# Patient Record
Sex: Male | Born: 1985 | Race: White | Hispanic: No | Marital: Single | Smoking: Never smoker
Health system: Southern US, Community
[De-identification: ages and names within clinical notes are randomized; demographics above are authoritative.]

---
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
## 2016-11-01 ENCOUNTER — Emergency Department (HOSPITAL_COMMUNITY)
Admission: EM | Admit: 2016-11-01 | Discharge: 2016-11-01 | Disposition: A | Discharge status: Home / Self Care | Payer: 59 | Attending: Emergency Medicine | Admitting: Emergency Medicine

## 2016-11-01 ENCOUNTER — Encounter (HOSPITAL_COMMUNITY): Payer: Self-pay | Admitting: Emergency Medicine

## 2016-11-01 DIAGNOSIS — A09 Infectious gastroenteritis and colitis, unspecified: Secondary | ICD-10-CM | POA: Insufficient documentation

## 2016-11-01 DIAGNOSIS — R197 Diarrhea, unspecified: Secondary | ICD-10-CM

## 2016-11-01 NOTE — ED Triage Notes (Signed)
Pt reports diarrhea since Saturday, denies any associated symptoms. Pt a/ox4, resp e/u.

## 2016-11-01 NOTE — ED Provider Notes (Signed)
[facility]EM[facility]Y DEPT Provider Note   CSN: [id number] Arrival date & time: 11/01/16  [id number]     History   Chief Complaint Chief Complaint  Patient presents with  . Diarrhea    HPI Keith Blackburn is a 31 y.o. male.  The history is provided by the patient.  Diarrhea   This is a new problem. Episode onset: 4-5 days. The problem occurs 2 to 4 times per day. The problem has not changed since onset.The stool consistency is described as watery. There has been no fever. Associated symptoms include abdominal pain. Pertinent negatives include no vomiting, no arthralgias, no myalgias, no URI and no cough. Associated symptoms comments: Intermittent abdominal pain that is crampy in nature and usually in the left lower quadrant. It comes and goes. Does not seem to be worse with eating. Patient has still been able to eat and drink. No blood in the stool.. He has tried nothing for the symptoms. The treatment provided no relief. Risk factors: no ill contact, recent abx in the last 2 months or bad food exposure/travel. Past medical history comments: none.    History reviewed. No pertinent past medical history.  There are no active problems to display for this patient.   History reviewed. No pertinent surgical history.     Home Medications    Prior to Admission medications   Not on File    Family History No family history on file.  Social History Social History  Substance Use Topics  . Smoking status: Not on file  . Smokeless tobacco: Not on file  . Alcohol use Not on file     Allergies   Patient has no known allergies.   Review of Systems Review of Systems  Respiratory: Negative for cough.   Gastrointestinal: Positive for abdominal pain and diarrhea. Negative for vomiting.  Musculoskeletal: Negative for arthralgias and myalgias.  All other systems reviewed and are negative.    Physical Exam Updated Vital Signs BP 137/88 (BP Location: Left Arm)   Pulse 70   Temp 98.2 F  (36.8 C) (Oral)   Resp 18   Ht 5\' 9"  (1.753 m)   Wt 225 lb (102.1 kg)   SpO2 98%   BMI 33.23 kg/m   Physical Exam  Constitutional: He is oriented to person, place, and time. He appears well-developed and well-nourished. No distress.  HENT:  Head: Normocephalic and atraumatic.  Mouth/Throat: Oropharynx is clear and moist.  Eyes: Conjunctivae and EOM are normal. Pupils are equal, round, and reactive to light.  Neck: Normal range of motion. Neck supple.  Cardiovascular: Normal rate, regular rhythm and intact distal pulses.   No murmur heard. Pulmonary/Chest: Effort normal and breath sounds normal. No respiratory distress. He has no wheezes. He has no rales.  Abdominal: Soft. He exhibits no distension. There is no tenderness. There is no rebound and no guarding.  Musculoskeletal: Normal range of motion. He exhibits no edema or tenderness.  Neurological: He is alert and oriented to person, place, and time.  Skin: Skin is warm and dry. No rash noted. No erythema.  Psychiatric: He has a normal mood and affect. His behavior is normal.  Nursing note and vitals reviewed.    ED Treatments / Results  Labs (all labs ordered are listed, but only abnormal results are displayed) Labs Reviewed - No data to display  EKG  EKG Interpretation None       Radiology No results found.  Procedures Procedures (including critical care time)  Medications Ordered in ED  Medications - No data to display   Initial Impression / Assessment and Plan / ED Course  I have reviewed the triage vital signs and the nursing notes.  Pertinent labs & imaging results that were available during my care of the patient were reviewed by me and considered in my medical decision making (see chart for details).     Patient presenting with diarrhea for the last 3-4 days. It has been intermittent and usually no more than 3-4 times per day. Minimal nausea intermittently but no vomiting or fever. No high risk factors  concerning for bacterial source or C. difficile. Patient has no reproducible abdominal pain currently concerning for diverticulitis or appendicitis. He does not take any medications and does not use opiates or alcohol. Discussed using Imodium and patient returning if he develops a fever, vomiting or excruciating pain that localizes to one area consistently  Final Clinical Impressions(s) / ED Diagnoses   Final diagnoses:  Diarrhea of presumed infectious origin    New Prescriptions New Prescriptions   No medications on file     Gwyneth Sprout, MD 11/01/16 [phone]

## 2016-12-22 ENCOUNTER — Emergency Department (HOSPITAL_COMMUNITY): Payer: 59

## 2016-12-22 ENCOUNTER — Encounter (HOSPITAL_COMMUNITY): Payer: Self-pay | Admitting: *Deleted

## 2016-12-22 ENCOUNTER — Emergency Department (HOSPITAL_COMMUNITY)
Admission: EM | Admit: 2016-12-22 | Discharge: 2016-12-22 | Disposition: A | Discharge status: Home / Self Care | Payer: 59 | Attending: Emergency Medicine | Admitting: Emergency Medicine

## 2016-12-22 DIAGNOSIS — Y999 Unspecified external cause status: Secondary | ICD-10-CM | POA: Diagnosis not present

## 2016-12-22 DIAGNOSIS — S8991XA Unspecified injury of right lower leg, initial encounter: Secondary | ICD-10-CM | POA: Diagnosis present

## 2016-12-22 DIAGNOSIS — M25561 Pain in right knee: Secondary | ICD-10-CM | POA: Insufficient documentation

## 2016-12-22 DIAGNOSIS — Z79899 Other long term (current) drug therapy: Secondary | ICD-10-CM | POA: Diagnosis not present

## 2016-12-22 DIAGNOSIS — Y9301 Activity, walking, marching and hiking: Secondary | ICD-10-CM | POA: Insufficient documentation

## 2016-12-22 DIAGNOSIS — Y929 Unspecified place or not applicable: Secondary | ICD-10-CM | POA: Diagnosis not present

## 2016-12-22 DIAGNOSIS — X509XXA Other and unspecified overexertion or strenuous movements or postures, initial encounter: Secondary | ICD-10-CM | POA: Insufficient documentation

## 2016-12-22 DIAGNOSIS — G8929 Other chronic pain: Secondary | ICD-10-CM | POA: Diagnosis not present

## 2016-12-22 MED ORDER — DICLOFENAC SODIUM 1 % TD GEL: 4.0000 g | Freq: Four times a day (QID) | TRANSDERMAL | 0 refills | Status: AC

## 2016-12-22 NOTE — Discharge Planning (Signed)
Pt up for discharge. EDCM reviewed chart for possible CM needs.  No needs identified or communicated.  

## 2016-12-22 NOTE — Discharge Instructions (Signed)
X-rays are normal. Please use the Voltaren gel to the knee as prescribed. Do not take any extra NSAIDS with this medication. May use Tylenol. Alternate ice and heat. Use the knee sleeve for compression and comfort. Please follow-up with orthopedist. Return to ED if you develop any redness in the area, fevers, unable to bend her leg or for any reason.

## 2016-12-22 NOTE — ED Triage Notes (Signed)
Pt reports R knee pain onset yesterday, denies injury, ambulatory, reports hearing clicking sound yesterday when walking, denies hx of injury, A&Ox4

## 2016-12-22 NOTE — ED Notes (Signed)
No large knee sleeves in stock. Ortho tech called.

## 2016-12-22 NOTE — Progress Notes (Signed)
Orthopedic Tech Progress Note Patient Details:  Keith Blackburn 02-03-1986 [id number]  Ortho Devices Type of Ortho Device: Knee Sleeve Ortho Device/Splint Location: rle Ortho Device/Splint Interventions: Application   Keith Blackburn 12/22/2016, 10:17 AM

## 2016-12-22 NOTE — ED Provider Notes (Signed)
[facility] Provider Note   CSN: [id number] Arrival date & time: 12/22/16  4098  By signing my name below, I, Phillips Climes, attest that this documentation has been prepared under the direction and in the presence of Rise Mu, PA-C.  Electronically Signed: Phillips Climes, Scribe. 12/22/2016. 10:09 AM.  History   Chief Complaint Chief Complaint  Patient presents with  . Knee Pain   Keith Blackburn is a 31 y.o. male with no pertinent PMHx who presents to the Emergency Department with complaints of his chronic right knee pain, which worsened yesterday. Pt reports hearing and feeling a "popping" when weight bearing and with movement. Sx are resolved here in the ED.   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   Pt denies experiencing any other acute sx, including fevers, nausea, vomiting numbness or weakness.   He is up and ambulating in the ED normally and with no complaints.   The history is provided by the patient. No language interpreter was used.   History reviewed. No pertinent past medical history.  There are no active problems to display for this patient.  History reviewed. No pertinent surgical history.   Home Medications    Prior to Admission medications   Medication Sig Start Date End Date Taking? Authorizing Provider  diclofenac sodium (VOLTAREN) 1 % GEL Apply 4 g topically 4 (four) times daily. 12/22/16   Rise Mu, PA-C    Family History No family history on file.  Social History Social History  Substance Use Topics  . Smoking status: Never Smoker  . Smokeless tobacco: Never Used  . Alcohol use Yes     Comment: occasional     Allergies   Patient has no known allergies.   Review of Systems Review of Systems  Constitutional: Negative for unexpected weight change.  Gastrointestinal: Negative for nausea and vomiting.  Musculoskeletal: Positive for arthralgias (Resolved).  Skin: Negative  for color change.  Neurological: Negative for weakness and numbness.     Physical Exam Updated Vital Signs BP (!) 159/78 (BP Location: Right Arm)   Pulse 78   Temp 97.7 F (36.5 C) (Oral)   Resp 14   Ht  (1.753 m)   Wt 229 lb (103.9 kg)   SpO2 99%   BMI 33.82 kg/m   Physical Exam  Constitutional: He is oriented to person, place, and time. He appears well-developed and well-nourished. No distress.  HENT:  Head: Normocephalic and atraumatic.  Cardiovascular: Normal rate.   Pulmonary/Chest: Effort normal.  Musculoskeletal:  Right knee with no erythema or edema. FROM and no tenderness to palpation. Foot/leg is neurovascularly intact. No pain with passive movement of joint. No evidence of injury or trauma. Negative Murray's sign. Negative Anterior Drawer Test. DP pulses 2+ bilaterally. Sensation intact to sharp/dull. Capillary refill normal.  Neurological: He is alert and oriented to person, place, and time.  Skin: Skin is warm and dry.  Psychiatric: He has a normal mood and affect.  Nursing note and vitals reviewed.  ED Treatments / Results  DIAGNOSTIC STUDIES: Oxygen Saturation is 99% on RA, nl by my interpretation.    COORDINATION OF CARE: 9:16 AM Discussed treatment plan with pt at bedside and pt agreed to plan. Discussed plan for outpatient f/u with orthopedics. He does not feel the need for crutches.  10:09 AM Updated pt on non-significant radiology findings. Will d/c home.   Labs (all labs ordered are listed, but only  abnormal results are displayed) Labs Reviewed - No data to display  EKG  EKG Interpretation None       Radiology Dg Knee Complete 4 Views Right  Result Date: 12/22/2016 CLINICAL DATA:  Pain and swelling EXAM: RIGHT KNEE - COMPLETE 4+ VIEW COMPARISON:  None. FINDINGS: Frontal, lateral, and bilateral oblique views were obtained. There is no fracture or dislocation. No joint effusion. Joint spaces appear normal. No erosive change. IMPRESSION: No  fracture or joint effusion.  No evident arthropathy. Electronically Signed   By: Bretta Bang III M.D.   On: 12/22/2016 09:53    Procedures Procedures (including critical care time)  Medications Ordered in ED Medications - No data to display   Initial Impression / Assessment and Plan / ED Course  I have reviewed the triage vital signs and the nursing notes.  Pertinent labs & imaging results that were available during my care of the patient were reviewed by me and considered in my medical decision making (see chart for details).     Pt presents with acute on chronic right knee pain. Neurovascularly intact. Normal strength and ROM. No sign of septic joint. Patient X-Ray negative for obvious fracture or dislocation. Pain managed in ED. Pt advised to follow up with orthopedics if symptoms persist for possibility of missed fracture diagnosis. Patient given brace while in ED, conservative therapy recommended and discussed. Patient will be dc home & is agreeable with above plan.   Final Clinical Impressions(s) / ED Diagnoses   Final diagnoses:  Acute pain of right knee    New Prescriptions New Prescriptions   DICLOFENAC SODIUM (VOLTAREN) 1 % GEL    Apply 4 g topically 4 (four) times daily.   I personally performed the services described in this documentation, which was scribed in my presence. The recorded information has been reviewed and is accurate.    Rise Mu, PA-C 12/22/16 2146    Azalia Bilis, MD 12/23/16 1049

## 2017-04-19 ENCOUNTER — Emergency Department (HOSPITAL_COMMUNITY): Payer: 59

## 2017-04-19 ENCOUNTER — Emergency Department (HOSPITAL_COMMUNITY)
Admission: EM | Admit: 2017-04-19 | Discharge: 2017-04-19 | Disposition: A | Discharge status: Home / Self Care | Payer: 59 | Attending: Emergency Medicine | Admitting: Emergency Medicine

## 2017-04-19 ENCOUNTER — Encounter (HOSPITAL_COMMUNITY): Payer: Self-pay

## 2017-04-19 DIAGNOSIS — R110 Nausea: Secondary | ICD-10-CM | POA: Insufficient documentation

## 2017-04-19 DIAGNOSIS — I10 Essential (primary) hypertension: Secondary | ICD-10-CM | POA: Diagnosis not present

## 2017-04-19 DIAGNOSIS — R0602 Shortness of breath: Secondary | ICD-10-CM | POA: Diagnosis not present

## 2017-04-19 DIAGNOSIS — F419 Anxiety disorder, unspecified: Secondary | ICD-10-CM | POA: Insufficient documentation

## 2017-04-19 LAB — BASIC METABOLIC PANEL
Anion gap: 8 (ref 5–15)
BUN: 15 mg/dL (ref 6–20)
CO2: 25 mmol/L (ref 22–32)
Calcium: 9.4 mg/dL (ref 8.9–10.3)
Chloride: 106 mmol/L (ref 101–111)
Creatinine, Ser: 0.7 mg/dL (ref 0.61–1.24)
GFR calc Af Amer: >60 mL/min (ref >60)
GFR calc non Af Amer: >60 mL/min (ref >60)
Glucose, Bld: 105 mg/dL — ABNORMAL HIGH (ref 65–99)
Potassium: 4.1 mmol/L (ref 3.5–5.1)
Sodium: 139 mmol/L (ref 135–145)

## 2017-04-19 LAB — URINALYSIS, ROUTINE W REFLEX MICROSCOPIC
Bilirubin Urine: NEGATIVE
Glucose, UA: NEGATIVE mg/dL
Hgb urine dipstick: NEGATIVE
Ketones, ur: NEGATIVE mg/dL
LEUKOCYTES UA: NEGATIVE
NITRITE: NEGATIVE
PH: 8 (ref 5.0–8.0)
Protein, ur: NEGATIVE mg/dL
SPECIFIC GRAVITY, URINE: 1.024 (ref 1.005–1.030)

## 2017-04-19 LAB — CBC
HCT: 43 % (ref 39.0–52.0)
Hemoglobin: 14.6 g/dL (ref 13.0–17.0)
MCH: 30.3 pg (ref 26.0–34.0)
MCHC: 34 g/dL (ref 30.0–36.0)
MCV: 89.2 fL (ref 78.0–100.0)
Platelets: 288 10*3/uL (ref 150–400)
RBC: 4.82 MIL/uL (ref 4.22–5.81)
RDW: 12.6 % (ref 11.5–15.5)
WBC: 13.4 10*3/uL — ABNORMAL HIGH (ref 4.0–10.5)

## 2017-04-19 LAB — RAPID URINE DRUG SCREEN, HOSP PERFORMED
Amphetamines: NOT DETECTED
BARBITURATES: NOT DETECTED
BENZODIAZEPINES: NOT DETECTED
COCAINE: NOT DETECTED
OPIATES: NOT DETECTED
TETRAHYDROCANNABINOL: NOT DETECTED

## 2017-04-19 NOTE — ED Provider Notes (Signed)
[facility] Provider Note   CSN: [id number] Arrival date & time: 04/19/17  0825     History   Chief Complaint Chief Complaint  Patient presents with  . multiple complaints/ anxiety    HPI Keith Blackburn is a 31 y.o. male.  He presents for evaluation of an episode shortness of breath characterized by having trouble "catching my breath."  This occurred after he awoke this morning, while lying in bed, in preparation to arise.  The shortness of breath has gradually improved.  He has persistent nausea which also started about the same time.  He describes additional symptoms recently of headache, muscle aches, anterior neck pain, and periods of losing his temper, and being angry, while at work.  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  He denies any episodes of palpitations, persistent chest pain, focal weakness, or paresthesia.  There are no other known modifying factors.  HPI  History reviewed. No pertinent past medical history.  There are no active problems to display for this patient.   History reviewed. No pertinent surgical history.     Home Medications    Prior to Admission medications   Medication Sig Start Date End Date Taking? Authorizing Provider  diclofenac sodium (VOLTAREN) 1 % GEL Apply 4 g topically 4 (four) times daily. Patient not taking: Reported on 04/19/2017 12/22/16   Rise Mu, PA-C    Family History No family history on file.  Social History Social History  Substance Use Topics  . Smoking status: Never Smoker  . Smokeless tobacco: Never Used  . Alcohol use Yes     Comment: occasional     Allergies   Patient has no known allergies.   Review of Systems Review of  Systems  All other systems reviewed and are negative.    Physical Exam Updated Vital Signs BP 139/70   Pulse 73   Temp 98.1 F (36.7 C) (Oral)   Resp (!) 24   SpO2 100%   Physical Exam  Constitutional: He is oriented to person, place, and time. He appears well-developed and well-nourished.  HENT:  Head: Normocephalic and atraumatic.  Right Ear: External ear normal.  Left Ear: External ear normal.  Eyes: Pupils are equal, round, and reactive to light. Conjunctivae and EOM are normal.  Neck: Normal range of motion and phonation normal. Neck supple.  Cardiovascular: Normal rate, regular rhythm and normal heart sounds.   Pulmonary/Chest: Effort normal and breath sounds normal. No respiratory distress. He exhibits no tenderness and no bony tenderness.  Abdominal: Soft. There is no tenderness.  Musculoskeletal: Normal range of motion.  Neurological: He is alert and oriented to person, place, and time. No cranial nerve deficit or sensory deficit. He exhibits normal muscle tone. Coordination normal.  No dysarthria, aphasia or nystagmus.  No pronator drift.  Normal gait.  Skin: Skin is warm, dry and intact.  Psychiatric: His behavior is normal. Judgment and thought content normal.  Mildly anxious  Nursing note and vitals reviewed.    ED Treatments / Results  Labs (all labs ordered are listed, but only abnormal results are displayed) Labs Reviewed  BASIC METABOLIC PANEL - Abnormal; Notable for the following:  Result Value   Glucose, Bld 105 (*)    All other components within normal limits  CBC - Abnormal; Notable for the following:    WBC 13.4 (*)    All other components within normal limits  URINALYSIS, ROUTINE W REFLEX MICROSCOPIC  RAPID URINE DRUG SCREEN, HOSP PERFORMED    EKG  EKG Interpretation  Date/Time:  Tuesday April 19 2017 08:48:13 EDT Ventricular Rate:  72 PR Interval:  152 QRS Duration: 92 QT Interval:  386 QTC Calculation: 422 R Axis:   32 Text  Interpretation:  Normal sinus rhythm with sinus arrhythmia Nonspecific ST abnormality Abnormal ECG No old tracing to compare Confirmed by Mancel Bale [phone]) on 04/19/2017 12:37:26 PM       Radiology Dg Chest 2 View  Result Date: 04/19/2017 CLINICAL DATA:  Chest pain, shortness of breath and tachycardia this morning. EXAM: CHEST  2 VIEW COMPARISON:  None. FINDINGS: The heart size and mediastinal contours are within normal limits. Both lungs are clear. The visualized skeletal structures are unremarkable. IMPRESSION: Normal chest x-ray Electronically Signed   By: Rudie Meyer M.D.   On: 04/19/2017 11:36    Procedures Procedures (including critical care time)  Medications Ordered in ED Medications - No data to display   Initial Impression / Assessment and Plan / ED Course  I have reviewed the triage vital signs and the nursing notes.  Pertinent labs & imaging results that were available during my care of the patient were reviewed by me and considered in my medical decision making (see chart for details).      Patient Vitals for the past 24 hrs:  BP Temp Temp src Pulse Resp SpO2  04/19/17 1315 139/70 - - 73 - 100 %  04/19/17 1300 115/74 - - 63 - 98 %  04/19/17 1245 136/84 - - 90 - 99 %  04/19/17 1215 - - - 68 (!) 24 100 %  04/19/17 1200 - - - 66 18 98 %  04/19/17 1145 - - - 73 17 99 %  04/19/17 0835 (!) 168/102 98.1 F (36.7 C) Oral 75 18 100 %    1:56 PM Reevaluation with update and discussion. After initial assessment and treatment, an updated evaluation reveals he is calm and comfortable now.  Blood pressure improved, with rest.  Findings discussed with patient and a male with him.  All questions were answered. Fionn Stracke L     Final Clinical Impressions(s) / ED Diagnoses   Final diagnoses:  Anxiety  Hypertension, unspecified type    Evaluation consistent with anxiety related shortness of breath, without evidence for acute pulmonary, cardiac or metabolic  abnormality.  Incidental hypertension, variable readings in the emergency department, also nonspecific.  Nursing Notes Reviewed/ Care Coordinated Applicable Imaging Reviewed Interpretation of Laboratory Data incorporated into ED treatment  The patient appears reasonably screened and/or stabilized for discharge and I doubt any other medical condition or other [facility] requiring further screening, evaluation, or treatment in the ED at this time prior to discharge.  Plan: Home Medications-OTC as needed; Home Treatments-watch dietary sodium, and work on stress management; return here if the recommended treatment, does not improve the symptoms; Recommended follow up-PCP checkup 1 or 2 weeks for blood pressure evaluation.  Consider seeing a counselor for stress management   New Prescriptions New Prescriptions   No medications on file     Mancel Bale, MD 04/19/17 1358

## 2017-04-19 NOTE — ED Triage Notes (Signed)
Patient complains of 1 week of general fatigue, intermittent headaches and this am before getting up for work had an episode of shortness of breath. Reports that he has stressful job and unsure if related. Has been seen in past at an urgent care and diagnosed with anxiety. Alert and oriented, NAD

## 2017-04-19 NOTE — Discharge Instructions (Signed)
Your tests today were essentially normal.  There were no complications associated with the trouble breathing.  It is most likely related to anxiety, and stress.  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  It may be helpful to see a therapist, for further evaluation and treatment.  We are supplying you with some information on where to go to get that kind of help.  Your blood pressure was somewhat elevated but tended to improve when you rested.  This may indicate that it is related to stress, as well.  For now, make sure that you are getting plenty of rest, drinking a lot of fluids and trying to watch her salt intake.  I also recommend that you see a primary care doctor for blood pressure check in 1 or 2 weeks.  We supplying some information on finding a primary care provider.

## 2017-04-19 NOTE — ED Notes (Signed)
Pt given water and a bag lunch

## 2017-04-19 NOTE — ED Notes (Signed)
Patient states he was getting ready for work this am and his heart started pounding and he was having a hard time catching his breath. States he hasn't felt well for about a week, with being tired and generalized weakness. . Denies sob at present.

## 2018-01-28 IMAGING — DX DG CHEST 2V
2 series · 2 of 2 positions shown · non-contrast
Comparison: None.

CLINICAL DATA: Chest pain, shortness of breath and tachycardia this
morning.

EXAM:
CHEST  2 VIEW

[w chest pa]
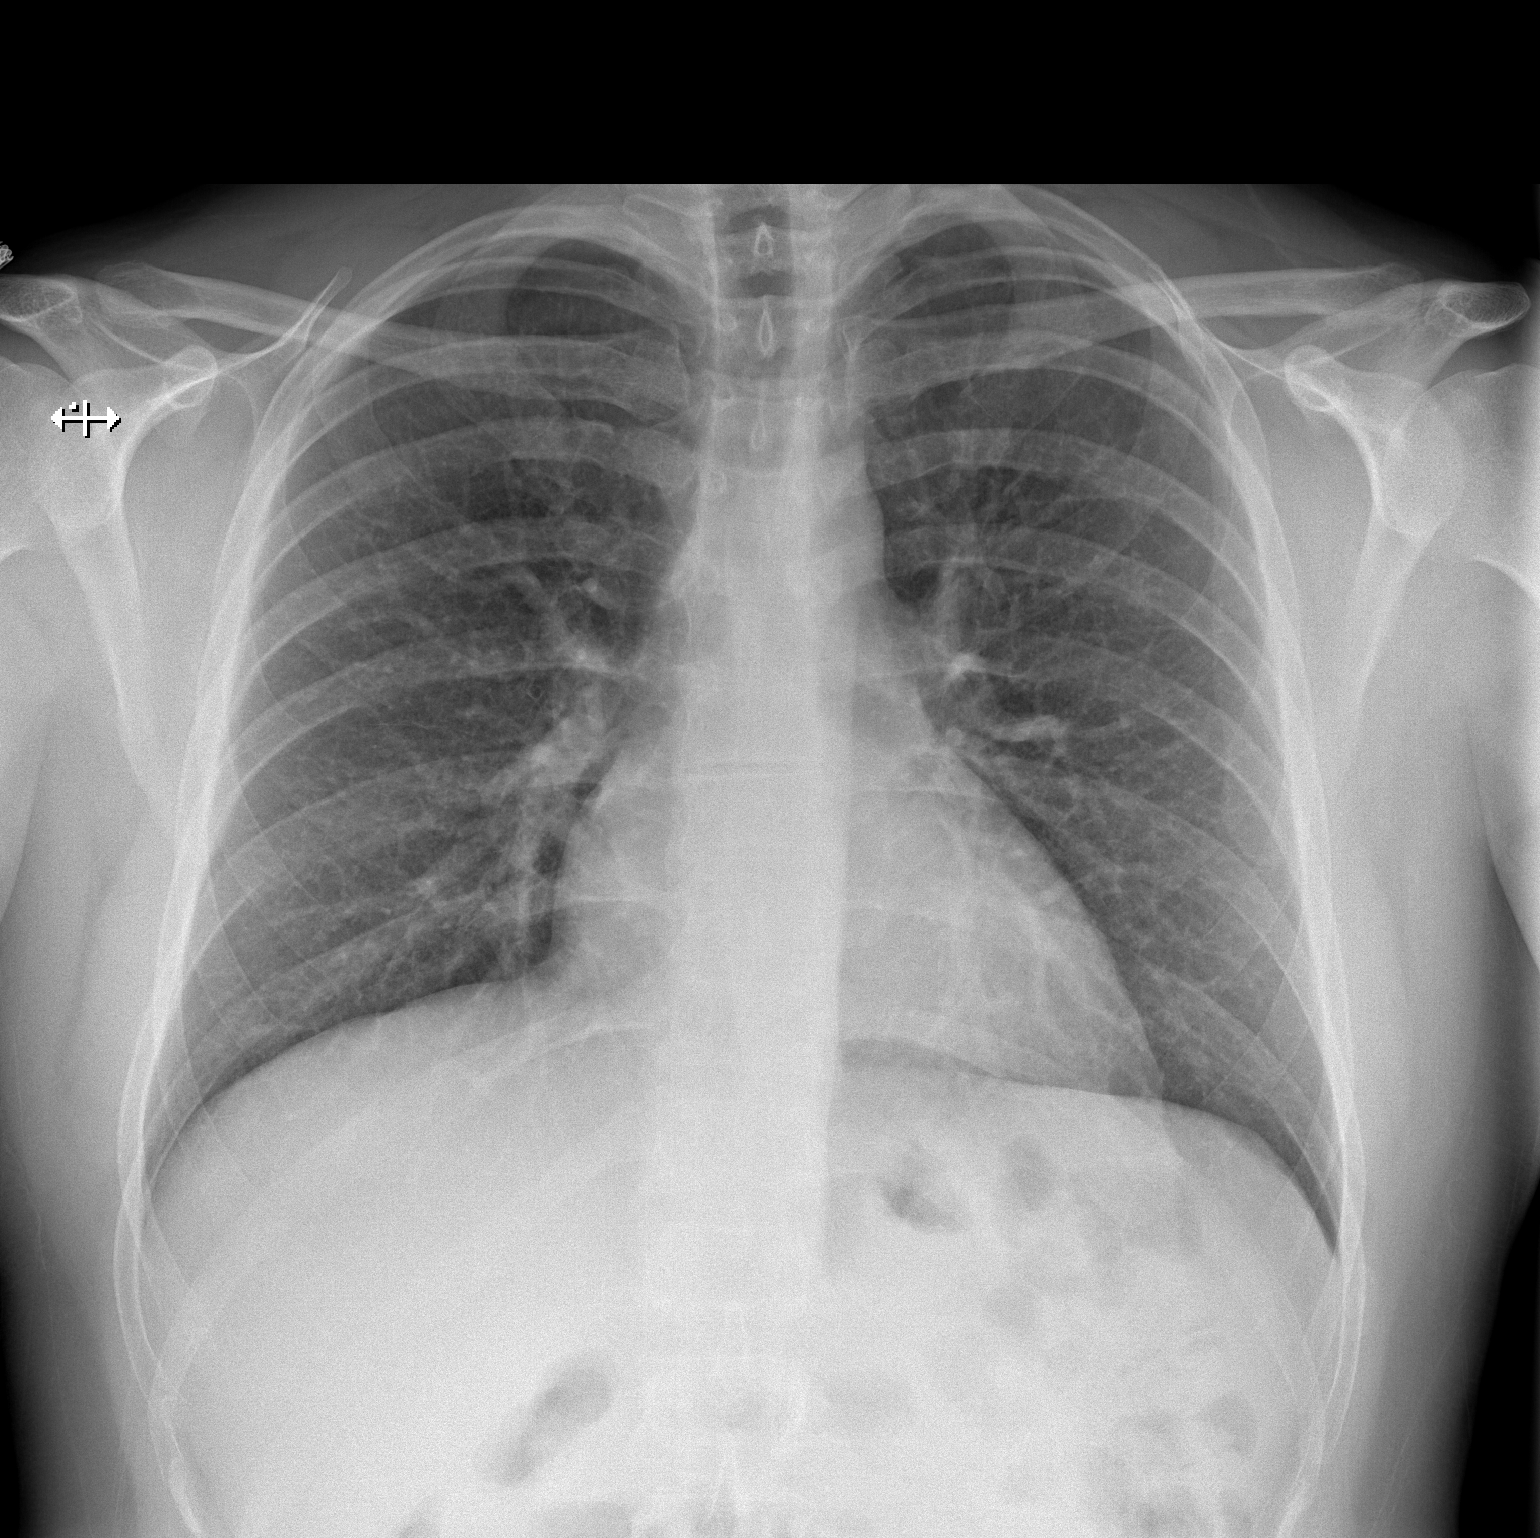

[w chest lat]
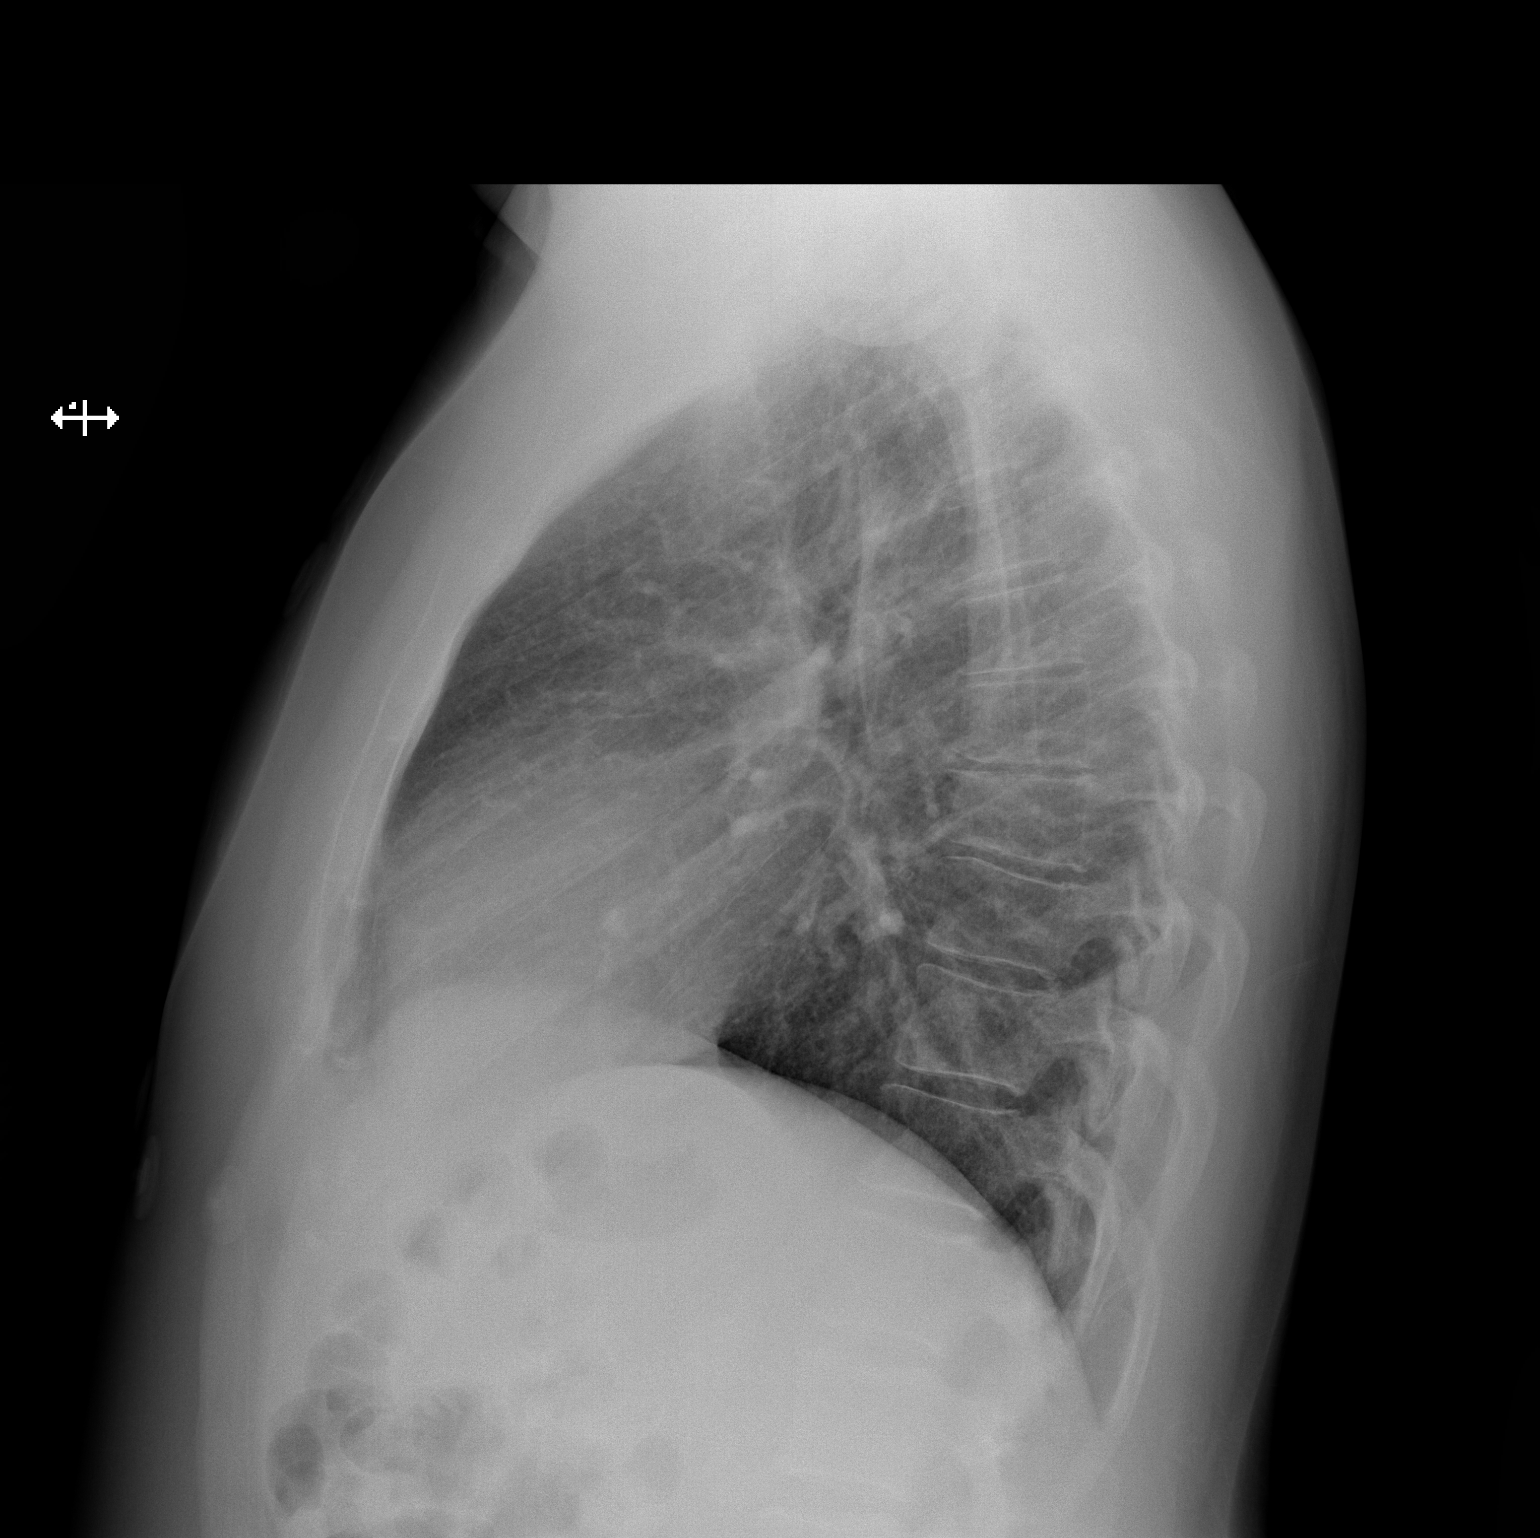

[2 of 2 positions shown; findings below may reference images not displayed]

FINDINGS: The heart size and mediastinal contours are within normal limits.
Both lungs are clear. The visualized skeletal structures are
unremarkable.
IMPRESSION: Normal chest x-ray

## 2024-04-03 NOTE — Progress Notes (Signed)
 Potassium minimally elevated, try to decrease potassium and sodium intake in diet. Otherwise CBC (complete blood count)/ CMP (kidney/ liver) are negative for infection, anemia or inflammation. No reason for current symptoms and should start Lisinopril as directed.

## 2024-04-03 NOTE — Progress Notes (Signed)
 Subjective Patient ID: Keith Blackburn is a 38 y.o. male.  Chief Complaint  Patient presents with  . Mass    Bump on left palm, and hand soreness of both hands x 3 months    The following information was reviewed by members of the visit team:  Tobacco  Allergies  Meds      38 yo male with Bump on left palm, and hand soreness of both hands x 3 months. He works with his hands but denies injury or strain. No OTC tried. Pain worse occasionally in the mornings and with gripping items. He is concerned about autoimmune disorders in mother and sister.   BP elevated at today's visit  and last visit. He has been off HTN meds for over a year. He was on Lisinopril 5 mg in past but did not go back to PCP for recheck of labs or BP levels. He notes increased fatigue and head fog and wants to restart HTN meds. Denies CP.      Medications Ordered Prior to Encounter[1] Allergies[2]    Review of Systems  Constitutional:  Positive for fatigue.  Respiratory:  Negative for chest tightness and shortness of breath.   Cardiovascular:  Negative for chest pain, palpitations and leg swelling.  Genitourinary:  Negative for difficulty urinating.  Musculoskeletal:  Positive for arthralgias. Negative for myalgias.  Skin:  Negative for rash.  Neurological:  Positive for headaches. Negative for dizziness, weakness and numbness.   BP (!) 158/98   Pulse 87   Temp 98.4 F (36.9 C) (Temporal)   Ht 1.753 m (5' 9)   Wt 100 kg (221 lb)   SpO2 99%   BMI 32.64 kg/m  INITIAL BP 158/98  Objective Physical Exam Vitals and nursing note reviewed.  Constitutional:      General: He is not in acute distress.    Appearance: Normal appearance. He is normal weight. He is not ill-appearing or toxic-appearing.  HENT:     Head: Normocephalic and atraumatic.     Right Ear: External ear normal.     Left Ear: External ear normal.     Nose: Nose normal.   Eyes:     General: No scleral icterus.       Right eye: No  discharge.        Left eye: No discharge.     Conjunctiva/sclera: Conjunctivae normal.    Cardiovascular:     Rate and Rhythm: Normal rate and regular rhythm.     Pulses: Normal pulses.     Heart sounds: Normal heart sounds.  Pulmonary:     Effort: Pulmonary effort is normal. No respiratory distress.     Breath sounds: Normal breath sounds. No wheezing or rhonchi.  Abdominal:     Palpations: Abdomen is soft.     Tenderness: There is no abdominal tenderness. There is no right CVA tenderness or left CVA tenderness.   Musculoskeletal:        General: No swelling, deformity or signs of injury.     Cervical back: Normal range of motion and neck supple.     Comments: Bilateral hands/ wrists- full ROM without obvious difficulty. He notes mild discomfort with flexion/ extension ROM of both wrist. NEG phalen/ Tinels. NEG De[place]ains. TTP base of LEFT thumb. Small lump in left palm without firmness or obvious TTP. RIGHT hand dominant.     Skin:    General: Skin is warm.     Capillary Refill: Capillary refill takes less than 2 seconds.  Findings: No bruising or rash.   Neurological:     General: No focal deficit present.     Mental Status: He is alert and oriented to person, place, and time.     Cranial Nerves: No cranial nerve deficit.     Sensory: No sensory deficit.     Motor: No weakness.     Coordination: Coordination normal.     Gait: Gait normal.   Psychiatric:        Mood and Affect: Mood normal.        Judgment: Judgment normal.     Assessment/Plan 1. Elevated blood pressure reading in office with diagnosis of hypertension  CBC with Differential   Comprehensive Metabolic Panel   lisinopriL (PRINIVIL) 10 mg tablet    2. Bilateral wrist pain      3. Bilateral hand pain       -restart Lisinopril at 5 mg x 3 days and then increase to 10 mg if BP is above 130/90.  -recheck BP if stays above 130/90 follow up with Primary care.  -Establish with new PCP ([id number]) -massages of hands daily after warm epsom salt soaks -follow up ORTHO if no change after 1 week of soaks and alternating -Tylenol 500 mg and Ibuprofen 400 mg every 4 hours x 3 days with food and water. -get cock up wrist splints with thumb included to use with activity and sleep.  -ER if symptoms increase, Primary care if symptoms do not improve    Electronically signed: Eleanor Clemencia Sharps, PA-C 04/03/2024  2:57 PM        [1] No current outpatient medications on file prior to visit.   No current facility-administered medications on file prior to visit.  [2] No Known Allergies

## 2024-07-30 ENCOUNTER — Ambulatory Visit (HOSPITAL_BASED_OUTPATIENT_CLINIC_OR_DEPARTMENT_OTHER): Payer: Self-pay
